# Patient Record
Sex: Female | Born: 1940 | Race: White | Hispanic: No | Marital: Married | Smoking: Never smoker
Health system: Southern US, Community
[De-identification: ages and names within clinical notes are randomized; demographics above are authoritative.]

## PROBLEM LIST (undated history)

## (undated) DIAGNOSIS — T7840XA Allergy, unspecified, initial encounter: Secondary | ICD-10-CM

## (undated) DIAGNOSIS — IMO0001 Reserved for inherently not codable concepts without codable children: Secondary | ICD-10-CM

## (undated) DIAGNOSIS — E559 Vitamin D deficiency, unspecified: Secondary | ICD-10-CM

## (undated) DIAGNOSIS — E049 Nontoxic goiter, unspecified: Secondary | ICD-10-CM

## (undated) DIAGNOSIS — K219 Gastro-esophageal reflux disease without esophagitis: Secondary | ICD-10-CM

## (undated) DIAGNOSIS — I38 Endocarditis, valve unspecified: Secondary | ICD-10-CM

## (undated) DIAGNOSIS — E785 Hyperlipidemia, unspecified: Secondary | ICD-10-CM

## (undated) DIAGNOSIS — K5790 Diverticulosis of intestine, part unspecified, without perforation or abscess without bleeding: Secondary | ICD-10-CM

## (undated) DIAGNOSIS — M1990 Unspecified osteoarthritis, unspecified site: Secondary | ICD-10-CM

## (undated) DIAGNOSIS — E039 Hypothyroidism, unspecified: Secondary | ICD-10-CM

## (undated) DIAGNOSIS — E079 Disorder of thyroid, unspecified: Secondary | ICD-10-CM

## (undated) HISTORY — DX: Disorder of thyroid, unspecified: E07.9

## (undated) HISTORY — PX: BREAST EXCISIONAL BIOPSY: SUR124

## (undated) HISTORY — DX: Reserved for inherently not codable concepts without codable children: IMO0001

## (undated) HISTORY — PX: BREAST SURGERY: SHX581

## (undated) HISTORY — DX: Gastro-esophageal reflux disease without esophagitis: K21.9

## (undated) HISTORY — DX: Allergy, unspecified, initial encounter: T78.40XA

---

## 2004-12-01 ENCOUNTER — Ambulatory Visit: Payer: Self-pay | Admitting: Ophthalmology

## 2005-04-27 ENCOUNTER — Ambulatory Visit: Payer: Self-pay | Admitting: Internal Medicine

## 2005-10-28 ENCOUNTER — Ambulatory Visit: Payer: Self-pay

## 2006-03-23 ENCOUNTER — Ambulatory Visit: Payer: Self-pay | Admitting: Internal Medicine

## 2006-05-04 ENCOUNTER — Ambulatory Visit: Payer: Self-pay | Admitting: Internal Medicine

## 2007-01-06 ENCOUNTER — Ambulatory Visit: Payer: Self-pay | Admitting: Internal Medicine

## 2008-01-17 ENCOUNTER — Ambulatory Visit: Payer: Self-pay | Admitting: Internal Medicine

## 2008-01-31 ENCOUNTER — Ambulatory Visit: Payer: Self-pay | Admitting: Internal Medicine

## 2008-02-14 ENCOUNTER — Ambulatory Visit: Payer: Self-pay | Admitting: Internal Medicine

## 2008-06-25 ENCOUNTER — Ambulatory Visit: Payer: Self-pay | Admitting: Internal Medicine

## 2009-03-05 ENCOUNTER — Ambulatory Visit: Payer: Self-pay | Admitting: Internal Medicine

## 2010-03-24 ENCOUNTER — Ambulatory Visit: Payer: Self-pay | Admitting: Obstetrics and Gynecology

## 2010-09-28 ENCOUNTER — Ambulatory Visit: Payer: Self-pay | Admitting: Obstetrics and Gynecology

## 2011-03-29 ENCOUNTER — Ambulatory Visit: Payer: Self-pay | Admitting: Surgery

## 2012-05-15 ENCOUNTER — Ambulatory Visit: Payer: Self-pay | Admitting: Obstetrics and Gynecology

## 2012-08-09 ENCOUNTER — Ambulatory Visit: Payer: Self-pay | Admitting: Obstetrics and Gynecology

## 2012-09-15 ENCOUNTER — Ambulatory Visit: Payer: Self-pay | Admitting: Surgery

## 2012-09-18 LAB — PATHOLOGY REPORT

## 2013-08-13 ENCOUNTER — Ambulatory Visit: Payer: Self-pay | Admitting: Unknown Physician Specialty

## 2014-07-10 ENCOUNTER — Ambulatory Visit: Payer: Self-pay

## 2014-07-10 ENCOUNTER — Encounter: Payer: Self-pay | Admitting: Podiatry

## 2014-07-10 ENCOUNTER — Ambulatory Visit (INDEPENDENT_AMBULATORY_CARE_PROVIDER_SITE_OTHER): Payer: Medicare Other

## 2014-07-10 ENCOUNTER — Ambulatory Visit (INDEPENDENT_AMBULATORY_CARE_PROVIDER_SITE_OTHER): Payer: Medicare Other | Admitting: Podiatry

## 2014-07-10 VITALS — BP 137/76 | HR 78 | Resp 16 | Ht 61.0 in | Wt 176.0 lb

## 2014-07-10 DIAGNOSIS — M1990 Unspecified osteoarthritis, unspecified site: Secondary | ICD-10-CM | POA: Insufficient documentation

## 2014-07-10 DIAGNOSIS — E010 Iodine-deficiency related diffuse (endemic) goiter: Secondary | ICD-10-CM | POA: Insufficient documentation

## 2014-07-10 DIAGNOSIS — E559 Vitamin D deficiency, unspecified: Secondary | ICD-10-CM | POA: Insufficient documentation

## 2014-07-10 DIAGNOSIS — M722 Plantar fascial fibromatosis: Secondary | ICD-10-CM

## 2014-07-10 DIAGNOSIS — K219 Gastro-esophageal reflux disease without esophagitis: Secondary | ICD-10-CM | POA: Insufficient documentation

## 2014-07-10 DIAGNOSIS — K5790 Diverticulosis of intestine, part unspecified, without perforation or abscess without bleeding: Secondary | ICD-10-CM | POA: Insufficient documentation

## 2014-07-10 DIAGNOSIS — E785 Hyperlipidemia, unspecified: Secondary | ICD-10-CM | POA: Insufficient documentation

## 2014-07-10 DIAGNOSIS — E538 Deficiency of other specified B group vitamins: Secondary | ICD-10-CM | POA: Insufficient documentation

## 2014-07-10 DIAGNOSIS — E039 Hypothyroidism, unspecified: Secondary | ICD-10-CM | POA: Insufficient documentation

## 2014-07-10 DIAGNOSIS — Z87898 Personal history of other specified conditions: Secondary | ICD-10-CM | POA: Insufficient documentation

## 2014-07-10 MED ORDER — METHYLPREDNISOLONE (PAK) 4 MG PO TABS: ORAL_TABLET | ORAL | Status: DC

## 2014-07-10 MED ORDER — MELOXICAM 15 MG PO TABS: 15.0000 mg | ORAL_TABLET | Freq: Every day | ORAL | Status: DC

## 2014-07-10 NOTE — Patient Instructions (Signed)

## 2014-07-10 NOTE — Progress Notes (Signed)
   Subjective:    Patient ID: Peggy Hill, female    DOB: Aug 10, 1940, 73 y.o.   MRN: [id number]  HPI Comments: The right heel painful and it goes up the back of the heel . Its been going on for over a year. Have had problems in the past with the heels. Had injections and orthotics. The left heel hurts as well plantar heel pain . The pain seems to move around the foot   Foot Pain      Review of Systems  HENT:       Sinus problems  Eyes: Positive for itching.  Cardiovascular: Positive for leg swelling.  Gastrointestinal: Positive for diarrhea.  Hematological: Bruises/bleeds easily.  All other systems reviewed and are negative.      Objective:   Physical Exam: I have reviewed her past medical history medications allergy surgery social history and review systems. Pulses are strongly palpable bilateral. Neurologic sensorium is intact percent twisting monofilament. Degenerative flexors are intact bilaterally muscle strength +5 over 5 dorsiflexion plantar flexion inversion everters and musculatures intact. Orthopedic evaluation shows all joints distal to the ankle for range of motion without crepitation. Cutaneous evaluationof well-hydrated is no erythema or edema cellulitis drainage or odor. Orthopedic evaluation does demonstrate inflammation me with right greater than left. Radiographic evaluation does demonstrate soft tissue increase in density at the plantar fascial insertion site bilateral heels. Right greater than left.      Assessment & Plan:  Assessment: Pain in limb plantar fasciitis bilateral right greater than left.  Plan: Discussed etiology pathology conservative versus surgical therapies. At this point injected her bilateral heels with Kenalog and local anesthetic dispensed a plantar fascial brace bilaterally and the night splint for the use of the worst foot. Dispensed prescription for Medrol Dosepak to be followed by meloxicam. We discussed appropriate shoe gear stress or  size ice therapy and shoe gear modifications area I will follow-up with her in 1 month.

## 2014-08-07 ENCOUNTER — Ambulatory Visit: Payer: No Typology Code available for payment source | Admitting: Podiatry

## 2014-08-19 ENCOUNTER — Ambulatory Visit (INDEPENDENT_AMBULATORY_CARE_PROVIDER_SITE_OTHER): Payer: Medicare Other | Admitting: Podiatry

## 2014-08-19 VITALS — BP 127/76 | HR 77 | Resp 16

## 2014-08-19 DIAGNOSIS — M722 Plantar fascial fibromatosis: Secondary | ICD-10-CM

## 2014-08-19 NOTE — Progress Notes (Signed)
She presents today chief complaint of plantar fasciitis. She states that it is 90% resolved.  Objective: Pulses are palpable pain. No reproducible pain on palpation.  Assessment: Residual plantar fasciitis right.  Plan: Continue to encourage range of motion exercises and continue all conservative therapies.

## 2014-11-22 NOTE — Op Note (Signed)
PATIENT NAME:  Peggy Hill, Peggy Hill MR#:  [id number] DATE OF BIRTH:  [birth year]  DATE OF PROCEDURE:  09/15/2012  PREOPERATIVE DIAGNOSIS: Right breast mass.   POSTOPERATIVE DIAGNOSIS: Right breast mass.  OPERATION: Right breast excisional biopsy.   SURGEON: Rodena Goldmann, M.D.   ANESTHESIA: General.   OPERATIVE PROCEDURE: With the patient in the supine position after the induction of appropriate general anesthesia, the patient's right breast was prepped with Betadine and draped in sterile towels. A transverse incision was made over the palpable lesion. The incision was carried down through the subcutaneous tissue with Bovie electrocautery. The lesion was palpated, and dissection carried out with the Bovie. However, I entered a cavity, which appeared to be a large contraction capsule from her previous augmentation surgery. A large portion of that area was removed down to the chest wall. The area was irrigated. Because of the large defect, a 7 mm flat Jackson-Pratt drain was inserted for assistance with postoperative drainage. The drain was secured with 3-0 nylon. The subcutaneous space was obliterated with 3-0 Vicryl. The skin was closed with a running subcuticular suture of 4-0 Vicryl. Benzoin and Steri-Strips were applied. The wounds were dressed sterilely and the patient went to the recovery room having tolerated the procedure well. Sponge, instrument and needle counts were correct x 2 in the operating room.    ____________________________ Rodena Goldmann III, MD rle:aw D: 09/15/2012 09:28:40 ET T: 09/15/2012 09:56:51 ET JOB#: [id number]  cc: Micheline Maze, MD, <Dictator> Adin Hector, MD Rodena Goldmann MD ELECTRONICALLY SIGNED 09/22/2012 17:17

## 2015-01-30 ENCOUNTER — Other Ambulatory Visit: Payer: Self-pay | Admitting: Obstetrics and Gynecology

## 2015-01-30 DIAGNOSIS — Z1231 Encounter for screening mammogram for malignant neoplasm of breast: Secondary | ICD-10-CM

## 2015-01-31 ENCOUNTER — Ambulatory Visit
Admission: RE | Admit: 2015-01-31 | Discharge: 2015-01-31 | Disposition: A | Discharge status: Home / Self Care | Payer: Medicare Other | Source: Ambulatory Visit | Attending: Obstetrics and Gynecology | Admitting: Obstetrics and Gynecology

## 2015-01-31 ENCOUNTER — Other Ambulatory Visit: Payer: Self-pay | Admitting: Obstetrics and Gynecology

## 2015-01-31 DIAGNOSIS — Z1231 Encounter for screening mammogram for malignant neoplasm of breast: Secondary | ICD-10-CM

## 2017-06-29 ENCOUNTER — Other Ambulatory Visit: Payer: Self-pay | Admitting: Internal Medicine

## 2017-06-29 DIAGNOSIS — Z1231 Encounter for screening mammogram for malignant neoplasm of breast: Secondary | ICD-10-CM

## 2017-07-21 ENCOUNTER — Ambulatory Visit
Admission: RE | Admit: 2017-07-21 | Discharge: 2017-07-21 | Disposition: A | Discharge status: Home / Self Care | Payer: Medicare Other | Source: Ambulatory Visit | Attending: Internal Medicine | Admitting: Internal Medicine

## 2017-07-21 DIAGNOSIS — Z1231 Encounter for screening mammogram for malignant neoplasm of breast: Secondary | ICD-10-CM

## 2017-09-05 ENCOUNTER — Other Ambulatory Visit
Admission: RE | Admit: 2017-09-05 | Discharge: 2017-09-05 | Disposition: A | Discharge status: Home / Self Care | Payer: Medicare Other | Source: Ambulatory Visit | Attending: Nurse Practitioner | Admitting: Nurse Practitioner

## 2017-09-05 DIAGNOSIS — R197 Diarrhea, unspecified: Secondary | ICD-10-CM | POA: Diagnosis present

## 2017-09-05 LAB — GASTROINTESTINAL PANEL BY PCR, STOOL (REPLACES STOOL CULTURE)

## 2017-09-05 LAB — C DIFFICILE QUICK SCREEN W PCR REFLEX
C Diff antigen: NEGATIVE
C Diff interpretation: NOT DETECTED
C Diff toxin: NEGATIVE

## 2017-11-09 ENCOUNTER — Encounter
Admission: RE | Disposition: A | Discharge status: Home / Self Care | Payer: Self-pay | Source: Ambulatory Visit | Attending: Unknown Physician Specialty

## 2017-11-09 ENCOUNTER — Ambulatory Visit: Payer: Medicare Other | Admitting: Certified Registered"

## 2017-11-09 ENCOUNTER — Ambulatory Visit
Admission: RE | Admit: 2017-11-09 | Discharge: 2017-11-09 | Disposition: A | Discharge status: Home / Self Care | Payer: Medicare Other | Source: Ambulatory Visit | Attending: Unknown Physician Specialty | Admitting: Unknown Physician Specialty

## 2017-11-09 ENCOUNTER — Encounter: Payer: Self-pay | Admitting: *Deleted

## 2017-11-09 DIAGNOSIS — K5730 Diverticulosis of large intestine without perforation or abscess without bleeding: Secondary | ICD-10-CM | POA: Insufficient documentation

## 2017-11-09 DIAGNOSIS — Z791 Long term (current) use of non-steroidal anti-inflammatories (NSAID): Secondary | ICD-10-CM | POA: Diagnosis not present

## 2017-11-09 DIAGNOSIS — F419 Anxiety disorder, unspecified: Secondary | ICD-10-CM | POA: Insufficient documentation

## 2017-11-09 DIAGNOSIS — Z79890 Hormone replacement therapy (postmenopausal): Secondary | ICD-10-CM | POA: Insufficient documentation

## 2017-11-09 DIAGNOSIS — K640 First degree hemorrhoids: Secondary | ICD-10-CM | POA: Diagnosis not present

## 2017-11-09 DIAGNOSIS — Z7982 Long term (current) use of aspirin: Secondary | ICD-10-CM | POA: Diagnosis not present

## 2017-11-09 DIAGNOSIS — M1990 Unspecified osteoarthritis, unspecified site: Secondary | ICD-10-CM | POA: Insufficient documentation

## 2017-11-09 DIAGNOSIS — E559 Vitamin D deficiency, unspecified: Secondary | ICD-10-CM | POA: Diagnosis not present

## 2017-11-09 DIAGNOSIS — E039 Hypothyroidism, unspecified: Secondary | ICD-10-CM | POA: Diagnosis not present

## 2017-11-09 DIAGNOSIS — Z79899 Other long term (current) drug therapy: Secondary | ICD-10-CM | POA: Diagnosis not present

## 2017-11-09 DIAGNOSIS — D120 Benign neoplasm of cecum: Secondary | ICD-10-CM | POA: Insufficient documentation

## 2017-11-09 DIAGNOSIS — K219 Gastro-esophageal reflux disease without esophagitis: Secondary | ICD-10-CM | POA: Diagnosis not present

## 2017-11-09 DIAGNOSIS — K529 Noninfective gastroenteritis and colitis, unspecified: Secondary | ICD-10-CM | POA: Diagnosis present

## 2017-11-09 HISTORY — DX: Diverticulosis of intestine, part unspecified, without perforation or abscess without bleeding: K57.90

## 2017-11-09 HISTORY — DX: Vitamin D deficiency, unspecified: E55.9

## 2017-11-09 HISTORY — DX: Unspecified osteoarthritis, unspecified site: M19.90

## 2017-11-09 HISTORY — DX: Endocarditis, valve unspecified: I38

## 2017-11-09 HISTORY — DX: Nontoxic goiter, unspecified: E04.9

## 2017-11-09 HISTORY — DX: Hyperlipidemia, unspecified: E78.5

## 2017-11-09 HISTORY — DX: Hypothyroidism, unspecified: E03.9

## 2017-11-09 HISTORY — PX: COLONOSCOPY WITH PROPOFOL: SHX5780

## 2017-11-09 HISTORY — DX: Gastro-esophageal reflux disease without esophagitis: K21.9

## 2017-11-09 SURGERY — COLONOSCOPY WITH PROPOFOL
Anesthesia: General

## 2017-11-09 MED ORDER — LIDOCAINE HCL (CARDIAC) 20 MG/ML IV SOLN
INTRAVENOUS | Status: DC | PRN
  Administered 2017-11-09: 50 mg via INTRATRACHEAL

## 2017-11-09 MED ORDER — LIDOCAINE HCL (PF) 2 % IJ SOLN
INTRAMUSCULAR | Status: AC
  Filled 2017-11-09: qty 10

## 2017-11-09 MED ORDER — SODIUM CHLORIDE 0.9 % IV SOLN: INTRAVENOUS | Status: DC | PRN

## 2017-11-09 MED ORDER — PROPOFOL 10 MG/ML IV BOLUS
INTRAVENOUS | Status: DC | PRN
  Administered 2017-11-09 (×2): 100 mg via INTRAVENOUS

## 2017-11-09 MED ORDER — SODIUM CHLORIDE 0.9 % IV SOLN: INTRAVENOUS | Status: DC

## 2017-11-09 MED ORDER — SODIUM CHLORIDE 0.9 % IV SOLN
INTRAVENOUS | Status: DC | PRN
  Administered 2017-11-09: 14:00:00 via INTRAVENOUS

## 2017-11-09 MED ORDER — PROPOFOL 500 MG/50ML IV EMUL
INTRAVENOUS | Status: DC | PRN
  Administered 2017-11-09: 100 ug/kg/min via INTRAVENOUS

## 2017-11-09 MED ORDER — PROPOFOL 500 MG/50ML IV EMUL
INTRAVENOUS | Status: AC
  Filled 2017-11-09: qty 50

## 2017-11-09 MED ORDER — PROPOFOL 10 MG/ML IV BOLUS
INTRAVENOUS | Status: AC
  Filled 2017-11-09: qty 20

## 2017-11-09 NOTE — Anesthesia Preprocedure Evaluation (Signed)
Anesthesia Evaluation  Patient identified by MRN, date of birth, ID band Patient awake    Reviewed: Allergy & Precautions, H&P , NPO status , reviewed documented beta blocker date and time   Airway Mallampati: II  TM Distance: >3 FB Neck ROM: full    Dental  (+) Chipped   Pulmonary    Pulmonary exam normal        Cardiovascular Normal cardiovascular exam     Neuro/Psych PSYCHIATRIC DISORDERS Anxiety    GI/Hepatic GERD  Controlled and Medicated,  Endo/Other  Hypothyroidism   Renal/GU      Musculoskeletal  (+) Arthritis ,   Abdominal   Peds  Hematology   Anesthesia Other Findings B12 deficiency  Arthritis, degenerative  Acid reflux  Big thyroid  DD (diverticular disease)  History of prolonged Q-T interval on ECG Echo 2011 with preserved LV function and mild MR.  Echo 3/13 unchanged from 2012             HLD (hyperlipidemia)  Adult hypothyroidism  Avitaminosis D    Reproductive/Obstetrics                             Anesthesia Physical Anesthesia Plan  ASA: III  Anesthesia Plan: General   Post-op Pain Management:    Induction:   PONV Risk Score and Plan: 3 and Propofol infusion  Airway Management Planned:   Additional Equipment:   Intra-op Plan:   Post-operative Plan:   Informed Consent: I have reviewed the patients History and Physical, chart, labs and discussed the procedure including the risks, benefits and alternatives for the proposed anesthesia with the patient or authorized representative who has indicated his/her understanding and acceptance.   Dental Advisory Given  Plan Discussed with: CRNA  Anesthesia Plan Comments:         Anesthesia Quick Evaluation

## 2017-11-09 NOTE — H&P (Signed)
Primary Care Physician:  Adin Hector, MD Primary Gastroenterologist:  Dr. Vira Agar  Pre-Procedure History & Physical: HPI:  Peggy Hill is a 77 y.o. female is here for an colonoscopy. This is for diarrhea.   Past Medical History:  Diagnosis Date  . Allergy   . Arthritis    DJD  . Diverticulosis   . GERD (gastroesophageal reflux disease)   . Goiter   . Hyperlipidemia   . Hypothyroidism   . Reflux   . Thyroid disease   . Valvular heart disease   . Vitamin D deficiency     Past Surgical History:  Procedure Laterality Date  . BREAST EXCISIONAL BIOPSY Right    negative,  . BREAST SURGERY      Prior to Admission medications   Medication Sig Start Date End Date Taking? Authorizing Provider  albuterol (PROAIR HFA) 108 (90 BASE) MCG/ACT inhaler Inhale into the lungs.   Yes [provider]  aspirin EC 325 MG tablet Take by mouth.   Yes [provider]  calcium carbonate (OS-CAL) 600 MG tablet Take by mouth.   Yes [provider]  Cholecalciferol (D 1000) 1000 UNITS capsule Take by mouth.   Yes [provider]  clonazePAM (KLONOPIN) 0.5 MG tablet  05/13/14  Yes [provider]  Cyanocobalamin 2500 MCG CHEW Chew by mouth.   Yes [provider]  levothyroxine (SYNTHROID, LEVOTHROID) 112 MCG tablet Take 112 mcg by mouth daily before breakfast.   Yes [provider]  meloxicam (MOBIC) 7.5 MG tablet Take by mouth.   Yes [provider]  methylPREDNIsolone (MEDROL DOSPACK) 4 MG tablet 6 day tapering dose-follow package instructions 07/10/14  Yes Hyatt, Max T, DPM  Omega-3 Fatty Acids (FISH OIL CONCENTRATE) 300 MG CAPS Take by mouth.   Yes [provider]  omeprazole (PRILOSEC) 20 MG capsule Take 20 mg by mouth daily.   Yes [provider]  Saccharomyces boulardii (FLORASTOR PO) Take by mouth 2 (two) times daily.   Yes [provider]  levothyroxine (SYNTHROID, LEVOTHROID) 100 MCG  tablet Take 100 mcg by mouth.     [provider]  meloxicam (MOBIC) 15 MG tablet Take 1 tablet (15 mg total) by mouth daily. Patient not taking: Reported on 11/09/2017 07/10/14   Garrel Ridgel, DPM  pantoprazole (PROTONIX) 40 MG tablet  05/08/14   [provider]    Allergies as of 11/08/2017  . (No Known Allergies)    Family History  Problem Relation Age of Onset  . Breast cancer Mother 30    Social History   Socioeconomic History  . Marital status: Unknown    Spouse name: Not on file  . Number of children: Not on file  . Years of education: Not on file  . Highest education level: Not on file  Occupational History  . Not on file  Social Needs  . Financial resource strain: Not on file  . Food insecurity:    Worry: Not on file    Inability: Not on file  . Transportation needs:    Medical: Not on file    Non-medical: Not on file  Tobacco Use  . Smoking status: Never Smoker  . Smokeless tobacco: Never Used  Substance and Sexual Activity  . Alcohol use: No    Alcohol/week: 0.0 oz  . Drug use: Never  . Sexual activity: Not on file  Lifestyle  . Physical activity:    Days per week: Not on file  Minutes per session: Not on file  . Stress: Not on file  Relationships  . Social connections:    Talks on phone: Not on file    Gets together: Not on file    Attends religious service: Not on file    Active member of club or organization: Not on file    Attends meetings of clubs or organizations: Not on file    Relationship status: Not on file  . Intimate partner violence:    Fear of current or ex partner: Not on file    Emotionally abused: Not on file    Physically abused: Not on file    Forced sexual activity: Not on file  Other Topics Concern  . Not on file  Social History Narrative  . Not on file    Review of Systems: See HPI, otherwise negative ROS  Physical Exam: BP 133/80   Pulse 95   Temp (!) 97.1 F (36.2 C) (Tympanic)   Resp 20   Ht  5' (1.524 m)   Wt 79.8 kg (176 lb)   SpO2 100%   BMI 34.37 kg/m  General:   Alert,  pleasant and cooperative in NAD Head:  Normocephalic and atraumatic. Neck:  Supple; no masses or thyromegaly. Lungs:  Clear throughout to auscultation.    Heart:  Regular rate and rhythm. Abdomen:  Soft, nontender and nondistended. Normal bowel sounds, without guarding, and without rebound.   Neurologic:  Alert and  oriented x4;  grossly normal neurologically.  Impression/Plan: Peggy Hill is here for an colonoscopy to be performed for diarrhea.  Risks, benefits, limitations, and alternatives regarding  colonoscopy have been reviewed with the patient.  Questions have been answered.  All parties agreeable.   Gaylyn Cheers, MD  11/09/2017, 2:05 PM

## 2017-11-09 NOTE — Transfer of Care (Signed)
Immediate Anesthesia Transfer of Care Note  Patient: Peggy Hill  Procedure(s) Performed: COLONOSCOPY WITH PROPOFOL (N/A )  Patient Location: PACU  Anesthesia Type:General  Level of Consciousness: drowsy and patient cooperative  Airway & Oxygen Therapy: Patient Spontanous Breathing  Post-op Assessment: Report given to RN, Post -op Vital signs reviewed and stable and Patient moving all extremities  Post vital signs: Reviewed and stable  Last Vitals:  Vitals Value Taken Time  BP 115/57 11/09/2017  2:32 PM  Temp 36.1 C 11/09/2017  2:34 PM  Pulse 78 11/09/2017  2:34 PM  Resp 25 11/09/2017  2:34 PM  SpO2 95 % 11/09/2017  2:34 PM  Vitals shown include unvalidated device data.  Last Pain:  Vitals:   11/09/17 1434  TempSrc: Tympanic         Complications: No apparent anesthesia complications

## 2017-11-09 NOTE — Anesthesia Post-op Follow-up Note (Signed)
Anesthesia QCDR form completed.        

## 2017-11-09 NOTE — Op Note (Signed)
[facility] Gastroenterology Patient Name: Peggy Hill Procedure Date: 11/09/2017 1:47 PM MRN: [id number] Account #: [id number] Date of Birth: 08-Apr-1941 Admit Type: Outpatient Age: 77 Room: [facility] ENDO ROOM 3 Gender: Female Note Status: Finalized Procedure:            Colonoscopy Indications:          Chronic diarrhea Providers:            Manya Silvas, MD Referring MD:         Ramonita Lab, MD (Referring MD) Medicines:            Propofol per Anesthesia Complications:        No immediate complications. Procedure:            Pre-Anesthesia Assessment:                       - After reviewing the risks and benefits, the patient                        was deemed in satisfactory condition to undergo the                        procedure.                       After obtaining informed consent, the colonoscope was                        passed under direct vision. Throughout the procedure,                        the patient's blood pressure, pulse, and oxygen                        saturations were monitored continuously. The                        Colonoscope was introduced through the anus and                        advanced to the the cecum, identified by appendiceal                        orifice and ileocecal valve. The colonoscopy was                        performed without difficulty. The patient tolerated the                        procedure well. The quality of the bowel preparation                        was excellent. Findings:      Multiple small-mouthed diverticula were found in the sigmoid colon.      A diminutive polyp was found in the cecum. The polyp was sessile. The       polyp was removed with a jumbo cold forceps. Resection and retrieval       were complete.      Due to diarrhea and uncertain cause I biopsied the colon in       ascending/transverse colon; also descending colon, and  sigmoid were       biopsied to rule out microscopic colitis.     Internal hemorrhoids were found during endoscopy. The hemorrhoids were       small and Grade I (internal hemorrhoids that do not prolapse). Impression:           - Diverticulosis in the sigmoid colon.                       - One diminutive polyp in the cecum, removed with a                        jumbo cold forceps. Resected and retrieved.                       - Internal hemorrhoids. Recommendation:       - Await pathology results. Manya Silvas, MD 11/09/2017 2:34:56 PM This report has been signed electronically. Number of Addenda: 0 Note Initiated On: 11/09/2017 1:47 PM Scope Withdrawal Time: 0 hours 9 minutes 51 seconds  Total Procedure Duration: 0 hours 17 minutes 56 seconds       [facility]

## 2017-11-09 NOTE — Anesthesia Postprocedure Evaluation (Signed)
Anesthesia Post Note  Patient: Peggy Hill  Procedure(s) Performed: COLONOSCOPY WITH PROPOFOL (N/A )  Patient location during evaluation: Endoscopy Anesthesia Type: General Level of consciousness: awake and alert, oriented and patient cooperative Pain management: satisfactory to patient Vital Signs Assessment: post-procedure vital signs reviewed and stable Respiratory status: spontaneous breathing and respiratory function stable Cardiovascular status: blood pressure returned to baseline and stable Postop Assessment: no headache, no backache, patient able to bend at knees, no apparent nausea or vomiting and adequate PO intake Anesthetic complications: no     Last Vitals:  Vitals:   11/09/17 1432 11/09/17 1434  BP: (!) 115/57   Pulse: 80   Resp: (!) 25   Temp: (!) 36.1 C (!) 36.1 C  SpO2: 96%     Last Pain:  Vitals:   11/09/17 1434  TempSrc: Tympanic                 Cythnia Osmun H Luccas [place]

## 2017-11-10 ENCOUNTER — Encounter: Payer: Self-pay | Admitting: Unknown Physician Specialty

## 2017-11-11 LAB — SURGICAL PATHOLOGY

## 2018-07-31 ENCOUNTER — Other Ambulatory Visit: Payer: Self-pay | Admitting: Internal Medicine

## 2018-07-31 DIAGNOSIS — Z1231 Encounter for screening mammogram for malignant neoplasm of breast: Secondary | ICD-10-CM

## 2018-08-23 ENCOUNTER — Ambulatory Visit
Admission: RE | Admit: 2018-08-23 | Discharge: 2018-08-23 | Disposition: A | Discharge status: Home / Self Care | Payer: Medicare Other | Source: Ambulatory Visit | Attending: Internal Medicine | Admitting: Internal Medicine

## 2018-08-23 DIAGNOSIS — Z1231 Encounter for screening mammogram for malignant neoplasm of breast: Secondary | ICD-10-CM | POA: Diagnosis not present

## 2018-09-05 ENCOUNTER — Other Ambulatory Visit: Payer: Self-pay

## 2018-09-05 ENCOUNTER — Emergency Department
Admission: EM | Admit: 2018-09-05 | Discharge: 2018-09-05 | Disposition: A | Discharge status: Home / Self Care | Payer: Medicare Other | Attending: Emergency Medicine | Admitting: Emergency Medicine

## 2018-09-05 ENCOUNTER — Emergency Department: Payer: Medicare Other

## 2018-09-05 ENCOUNTER — Encounter: Payer: Self-pay | Admitting: Emergency Medicine

## 2018-09-05 DIAGNOSIS — B349 Viral infection, unspecified: Secondary | ICD-10-CM | POA: Insufficient documentation

## 2018-09-05 DIAGNOSIS — E039 Hypothyroidism, unspecified: Secondary | ICD-10-CM | POA: Insufficient documentation

## 2018-09-05 DIAGNOSIS — R112 Nausea with vomiting, unspecified: Secondary | ICD-10-CM

## 2018-09-05 DIAGNOSIS — Z79899 Other long term (current) drug therapy: Secondary | ICD-10-CM | POA: Diagnosis not present

## 2018-09-05 DIAGNOSIS — R197 Diarrhea, unspecified: Secondary | ICD-10-CM

## 2018-09-05 LAB — COMPREHENSIVE METABOLIC PANEL
ALBUMIN: 3.7 g/dL (ref 3.5–5.0)
ALT: 52 U/L — ABNORMAL HIGH (ref 0–44)
ANION GAP: 7 (ref 5–15)
AST: 58 U/L — ABNORMAL HIGH (ref 15–41)
Alkaline Phosphatase: 111 U/L (ref 38–126)
BILIRUBIN TOTAL: 0.6 mg/dL (ref 0.3–1.2)
BUN: 16 mg/dL (ref 8–23)
CHLORIDE: 107 mmol/L (ref 98–111)
CO2: 22 mmol/L (ref 22–32)
Calcium: 8.7 mg/dL — ABNORMAL LOW (ref 8.9–10.3)
Creatinine, Ser: 0.97 mg/dL (ref 0.44–1.00)
GFR calc Af Amer: >60 mL/min (ref >60)
GFR calc non Af Amer: 57 mL/min — ABNORMAL LOW (ref >60)
GLUCOSE: 141 mg/dL — AB (ref 70–99)
POTASSIUM: 3.6 mmol/L (ref 3.5–5.1)
SODIUM: 136 mmol/L (ref 135–145)
TOTAL PROTEIN: 7.6 g/dL (ref 6.5–8.1)

## 2018-09-05 LAB — CBC WITH DIFFERENTIAL/PLATELET
Abs Immature Granulocytes: 0.03 10*3/uL (ref 0.00–0.07)
BASOS ABS: 0.1 10*3/uL (ref 0.0–0.1)
BASOS PCT: 1 %
EOS ABS: 0.2 10*3/uL (ref 0.0–0.5)
Eosinophils Relative: 2 %
HEMATOCRIT: 43.8 % (ref 36.0–46.0)
Hemoglobin: 14 g/dL (ref 12.0–15.0)
Immature Granulocytes: 0 %
Lymphocytes Relative: 14 %
Lymphs Abs: 1.3 10*3/uL (ref 0.7–4.0)
MCH: 27.9 pg (ref 26.0–34.0)
MCHC: 32 g/dL (ref 30.0–36.0)
MCV: 87.4 fL (ref 80.0–100.0)
Monocytes Absolute: 1.1 10*3/uL — ABNORMAL HIGH (ref 0.1–1.0)
Monocytes Relative: 12 %
NEUTROS PCT: 71 %
NRBC: 0 % (ref 0.0–0.2)
Neutro Abs: 6.9 10*3/uL (ref 1.7–7.7)
PLATELETS: 196 10*3/uL (ref 150–400)
RBC: 5.01 MIL/uL (ref 3.87–5.11)
RDW: 13.5 % (ref 11.5–15.5)
WBC: 9.6 10*3/uL (ref 4.0–10.5)

## 2018-09-05 LAB — C DIFFICILE QUICK SCREEN W PCR REFLEX
C DIFFICILE (CDIFF) TOXIN: NEGATIVE
C DIFFICLE (CDIFF) ANTIGEN: NEGATIVE
C Diff interpretation: NOT DETECTED

## 2018-09-05 LAB — INFLUENZA PANEL BY PCR (TYPE A & B)
Influenza A By PCR: NEGATIVE
Influenza B By PCR: NEGATIVE

## 2018-09-05 LAB — TROPONIN I: Troponin I: <0.03 ng/mL (ref <0.03)

## 2018-09-05 MED ORDER — ONDANSETRON 4 MG PO TBDP: 4.0000 mg | ORAL_TABLET | Freq: Three times a day (TID) | ORAL | 0 refills | Status: AC | PRN

## 2018-09-05 MED ORDER — SODIUM CHLORIDE 0.9 % IV BOLUS
1000.0000 mL | Freq: Once | INTRAVENOUS | Status: AC
  Administered 2018-09-05: 1000 mL via INTRAVENOUS

## 2018-09-05 MED ORDER — ONDANSETRON HCL 4 MG/2ML IJ SOLN
4.0000 mg | Freq: Once | INTRAMUSCULAR | Status: AC
  Administered 2018-09-05: 4 mg via INTRAVENOUS

## 2018-09-05 MED ORDER — ONDANSETRON HCL 4 MG/2ML IJ SOLN
INTRAMUSCULAR | Status: AC
Start: 2018-09-05 — End: 2018-09-05
  Administered 2018-09-05: 4 mg via INTRAVENOUS
  Filled 2018-09-05: qty 2

## 2018-09-05 NOTE — ED Triage Notes (Signed)
Pt from home vi[facility]. Per EMS pt was dx with upper respiratory infection yesterday, pt was Rx 120mg  of doxycycline which she took one last night and this morning. Per EMS pt started to feel/have episodes of N/V/D this am; pt slept on recliner all night lat night. 4mg  of zofran was given to pt per [facility]. Upon arrival SpO2 82%, pt was placed on 2L [place] bring her up to 94% SpO2. Per EMS 132CBG; 170/80; [id number].

## 2018-09-05 NOTE — ED Notes (Signed)
ED Provider at bedside. 

## 2018-09-05 NOTE — ED Notes (Addendum)
Pt was able to ambulate to toilet. Pt SpO2 at 89%, pt placed on 2L [place]. PT has 1X of diarrhea. Pt denies any Nausea/dizziness  at this time. This RN will continue to monitor pt.

## 2018-09-05 NOTE — ED Notes (Signed)
Pt states she started doxycycline yesterday. Pt states "I took a pill last night and this morning and after that I had diarrhea" Pt denies any pain at this time. Pt states "I just feel weak"

## 2018-09-05 NOTE — ED Provider Notes (Signed)
[facility] Emergency Department Provider Note  ____________________________________________  Time seen: Approximately 9:31 AM  I have reviewed the triage vital signs and the nursing notes.   HISTORY  Chief Complaint Weakness   HPI Peggy Hill is a 78 y.o. female with history of hypothyroidism, GERD, HLD who presents for evaluation of N/V/D.  Patient reports 3 days ago she started with a cough and sore throat.  Yesterday she went to walk-in clinic and she was told she had a upper respiratory infection.  She was put on doxycycline.  She reports taking 3 doses of it.  She woke up in the middle of the night with severe nausea, has had several episodes of nonbloody nonbilious emesis and watery diarrhea since early this morning.  No abdominal pain, no chest pain or shortness of breath.  Patient denies any fevers at home however she has been taking DayQuil for her symptoms. She reports feeling very weak.   Past Medical History:  Diagnosis Date  . Allergy   . Arthritis    DJD  . Diverticulosis   . GERD (gastroesophageal reflux disease)   . Goiter   . Hyperlipidemia   . Hypothyroidism   . Reflux   . Thyroid disease   . Valvular heart disease   . Vitamin D deficiency     Patient Active Problem List   Diagnosis Date Noted  . B12 deficiency 07/10/2014  . Arthritis, degenerative 07/10/2014  . Acid reflux 07/10/2014  . Big thyroid 07/10/2014  . DD (diverticular disease) 07/10/2014  . History of prolonged Q-T interval on ECG 07/10/2014  . HLD (hyperlipidemia) 07/10/2014  . Adult hypothyroidism 07/10/2014  . Avitaminosis D 07/10/2014    Past Surgical History:  Procedure Laterality Date  . BREAST EXCISIONAL BIOPSY Right    negative,  . BREAST SURGERY    . COLONOSCOPY WITH PROPOFOL N/A 11/09/2017   Procedure: COLONOSCOPY WITH PROPOFOL;  Surgeon: Manya Silvas, MD;  Location: [facility] ENDOSCOPY;  Service: Endoscopy;  Laterality: N/A;    Prior to  Admission medications   Medication Sig Start Date End Date Taking? Authorizing Provider  albuterol (PROAIR HFA) 108 (90 BASE) MCG/ACT inhaler Inhale 2 puffs into the lungs every 6 (six) hours as needed.    Yes [provider]  Cholecalciferol (D 1000) 1000 UNITS capsule Take 1,000 Units by mouth daily.    Yes [provider]  clonazePAM (KLONOPIN) 0.5 MG tablet Take 0.5 mg by mouth daily as needed for anxiety.  05/13/14  Yes [provider]  Cyanocobalamin 2500 MCG TABS Take 2,500 mcg by mouth daily.    Yes [provider]  doxycycline (VIBRA-TABS) 100 MG tablet Take 100 mg by mouth 2 (two) times daily. 09/04/18 09/14/18 Yes [provider]  levothyroxine (SYNTHROID, LEVOTHROID) 112 MCG tablet Take 112 mcg by mouth daily before breakfast.   Yes [provider]  omeprazole (PRILOSEC) 20 MG capsule Take 20 mg by mouth daily.   Yes [provider]  ondansetron (ZOFRAN ODT) 4 MG disintegrating tablet Take 1 tablet (4 mg total) by mouth every 8 (eight) hours as needed. 09/05/18   Rudene Re, MD    Allergies Patient has no known allergies.  Family History  Problem Relation Age of Onset  . Breast cancer Mother 56    Social History Social History   Tobacco Use  . Smoking status: Never Smoker  . Smokeless tobacco: Never Used  Substance Use Topics  . Alcohol use: No  Alcohol/week: 0.0 standard drinks  . Drug use: Never    Review of Systems  Constitutional: Negative for fever. + generalized weakness Eyes: Negative for visual changes. ENT: + sore throat and congestion Neck: No neck pain  Cardiovascular: Negative for chest pain. Respiratory: Negative for shortness of breath. + cough Gastrointestinal: Negative for abdominal pain. + vomiting and diarrhea. Genitourinary: Negative for dysuria. Musculoskeletal: Negative for back pain. Skin: Negative for rash. Neurological: Negative for headaches, weakness or  numbness. Psych: No SI or HI  ____________________________________________   PHYSICAL EXAM:  VITAL SIGNS: Vitals:   09/05/18 1030 09/05/18 1103  BP: 137/70   Pulse: 72   Resp: 13   Temp:  97.6 F (36.4 C)  SpO2: 98%   HR 71  Constitutional: Alert and oriented, actively vomiting.  HEENT:      Head: Normocephalic and atraumatic.         Eyes: Conjunctivae are normal. Sclera is non-icteric.       Mouth/Throat: Mucous membranes are moist.       Neck: Supple with no signs of meningismus. Cardiovascular: Regular rate and rhythm. No murmurs, gallops, or rubs. 2+ symmetrical distal pulses are present in all extremities. No JVD. Respiratory: Normal respiratory effort. Lungs are clear to auscultation bilaterally. No wheezes, crackles, or rhonchi.  Gastrointestinal: Soft, non tender, and non distended with positive bowel sounds. No rebound or guarding. Musculoskeletal: Nontender with normal range of motion in all extremities. No edema, cyanosis, or erythema of extremities. Neurologic: Normal speech and language. Face is symmetric. Moving all extremities. No gross focal neurologic deficits are appreciated. Skin: Skin is warm, dry and intact. No rash noted. Psychiatric: Mood and affect are normal. Speech and behavior are normal.  ____________________________________________   LABS (all labs ordered are listed, but only abnormal results are displayed)  Labs Reviewed  CBC WITH DIFFERENTIAL/PLATELET - Abnormal; Notable for the following components:      Result Value   Monocytes Absolute 1.1 (*)    All other components within normal limits  COMPREHENSIVE METABOLIC PANEL - Abnormal; Notable for the following components:   Glucose, Bld 141 (*)    Calcium 8.7 (*)    AST 58 (*)    ALT 52 (*)    GFR calc non Af Amer 57 (*)    All other components within normal limits  C DIFFICILE QUICK SCREEN W PCR REFLEX  INFLUENZA PANEL BY PCR (TYPE A & B)  TROPONIN I    ____________________________________________  EKG  ED ECG REPORT I, Rudene Re, the attending physician, personally viewed and interpreted this ECG.  Normal sinus rhythm with first-degree AV block, rate of 75, normal QTC, borderline left axis deviation, Q waves in inferior and anterior leads, no ST elevations or depressions.  No prior for comparison. ____________________________________________  RADIOLOGY  I have personally reviewed the images performed during this visit and I agree with the Radiologist's read.   Interpretation by Radiologist:  Dg Chest 2 View  Result Date: 09/05/2018 CLINICAL DATA:  Cough. EXAM: CHEST - 2 VIEW COMPARISON:  None. FINDINGS: The heart size and mediastinal contours are within normal limits. Both lungs are clear. The visualized skeletal structures are unremarkable. IMPRESSION: No active cardiopulmonary disease. Electronically Signed   By: Marijo Conception, M.D.   On: 09/05/2018 09:34      ____________________________________________   PROCEDURES  Procedure(s) performed: None Procedures Critical Care performed:  None ____________________________________________   INITIAL IMPRESSION / ASSESSMENT AND PLAN / ED COURSE   78 y.o. female  with history of hypothyroidism, GERD, HLD who presents for evaluation of N/V/D and generalized weakness after 3 days of cough, congestion, sore throat and now on doxycycline.  Ddx Flu, PNA, antibiotic side effect  Initially hypoxic however with moving in bed for cleaning of diarrhea, hypoxia corrected most likely atelectasis. Will continue to monitor closely. Abdomen is soft with no tenderness to palpation, lungs are clear to auscultation with normal work of breathing.  Plan for chest x-ray, labs, flu swab, will send stool for C. difficile.  Will give IV fluids and Zofran.  Clinical Course as of Sep 05 1154  Tue Sep 05, 2018  1153 Labs with no acute findings.  Flu negative.  C. difficile is negative.  Patient  is tolerating p.o. with no further episodes of vomiting.  At this time will discharge home with a prescription for Zofran and close follow-up with her primary care doctor.  Patient does endorse a history of chronic diarrhea for which she follows up with GI.  I recommended stopping doxycycline since her chest x-ray is negative for pneumonia and she does not have a history of COPD or emphysema.  Discussed more of a bland diet.  Discussed standard return precautions with her and her husband.   [CV]    Clinical Course User Index [CV] Alfred Levins [place], MD     As part of my medical decision making, I reviewed the following data within the [place] notes reviewed and incorporated, Labs reviewed , Old chart reviewed, Radiograph reviewed , Notes from prior ED visits and [place] Controlled Substance Database    Pertinent labs & imaging results that were available during my care of the patient were reviewed by me and considered in my medical decision making (see chart for details).    ____________________________________________   FINAL CLINICAL IMPRESSION(S) / ED DIAGNOSES  Final diagnoses:  Nausea vomiting and diarrhea  Viral illness      NEW MEDICATIONS STARTED DURING THIS VISIT:  ED Discharge Orders         Ordered    ondansetron (ZOFRAN ODT) 4 MG disintegrating tablet  Every 8 hours PRN     09/05/18 1156           Note:  This document was prepared using Dragon voice recognition software and may include unintentional dictation errors.    Alfred Levins, [place], MD 09/05/18 [phone]

## 2018-09-05 NOTE — ED Notes (Signed)
Patient transported to X-ray 

## 2018-09-05 NOTE — ED Notes (Signed)
Blanket given to pt. Family at bedside.

## 2019-08-08 ENCOUNTER — Other Ambulatory Visit: Payer: Self-pay | Admitting: Internal Medicine

## 2019-08-08 DIAGNOSIS — R748 Abnormal levels of other serum enzymes: Secondary | ICD-10-CM

## 2019-08-13 ENCOUNTER — Ambulatory Visit
Admission: RE | Admit: 2019-08-13 | Discharge: 2019-08-13 | Disposition: A | Discharge status: Home / Self Care | Payer: Medicare Other | Source: Ambulatory Visit | Attending: Internal Medicine | Admitting: Internal Medicine

## 2019-08-13 ENCOUNTER — Other Ambulatory Visit: Payer: Self-pay

## 2019-08-13 DIAGNOSIS — R748 Abnormal levels of other serum enzymes: Secondary | ICD-10-CM

## 2019-10-19 ENCOUNTER — Other Ambulatory Visit: Payer: Self-pay | Admitting: Internal Medicine

## 2019-10-22 ENCOUNTER — Other Ambulatory Visit: Payer: Self-pay | Admitting: Internal Medicine

## 2019-10-22 DIAGNOSIS — Z1231 Encounter for screening mammogram for malignant neoplasm of breast: Secondary | ICD-10-CM

## 2019-11-01 ENCOUNTER — Ambulatory Visit
Admission: RE | Admit: 2019-11-01 | Discharge: 2019-11-01 | Disposition: A | Discharge status: Home / Self Care | Payer: Medicare Other | Source: Ambulatory Visit | Attending: Internal Medicine | Admitting: Internal Medicine

## 2019-11-01 DIAGNOSIS — Z1231 Encounter for screening mammogram for malignant neoplasm of breast: Secondary | ICD-10-CM | POA: Diagnosis not present

## 2019-12-06 IMAGING — MG MM DIGITAL SCREENING BILAT W/ TOMO W/ CAD
8 of 12 series · 8 of 28 positions shown · non-contrast
Comparison: Previous exam(s).

CLINICAL DATA: Screening.

EXAM:
2D DIGITAL SCREENING BILATERAL MAMMOGRAM WITH CAD AND ADJUNCT TOMO

[L MLO]
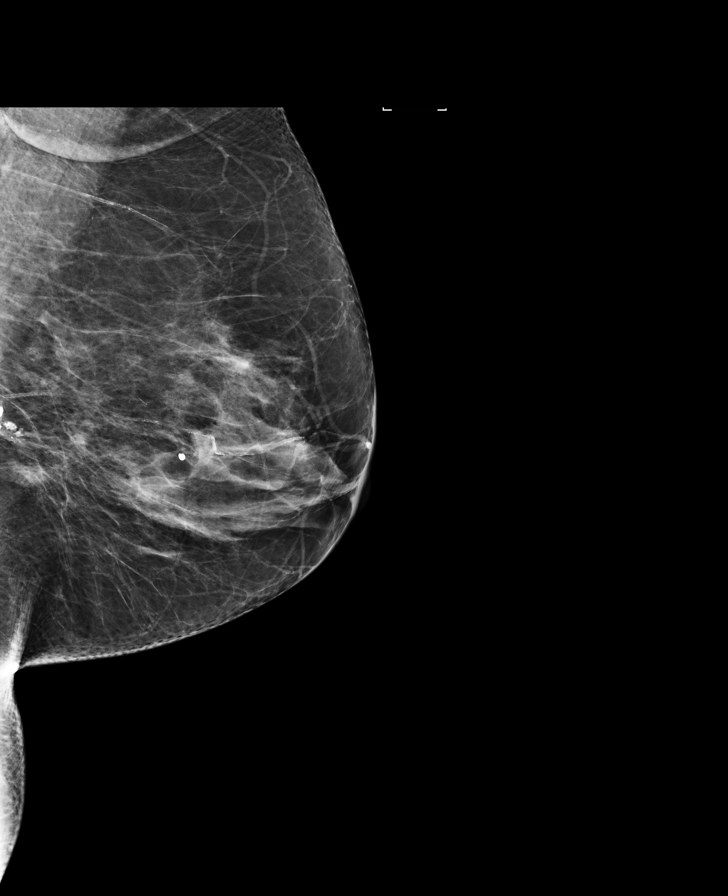

[L MLO synth-2D]
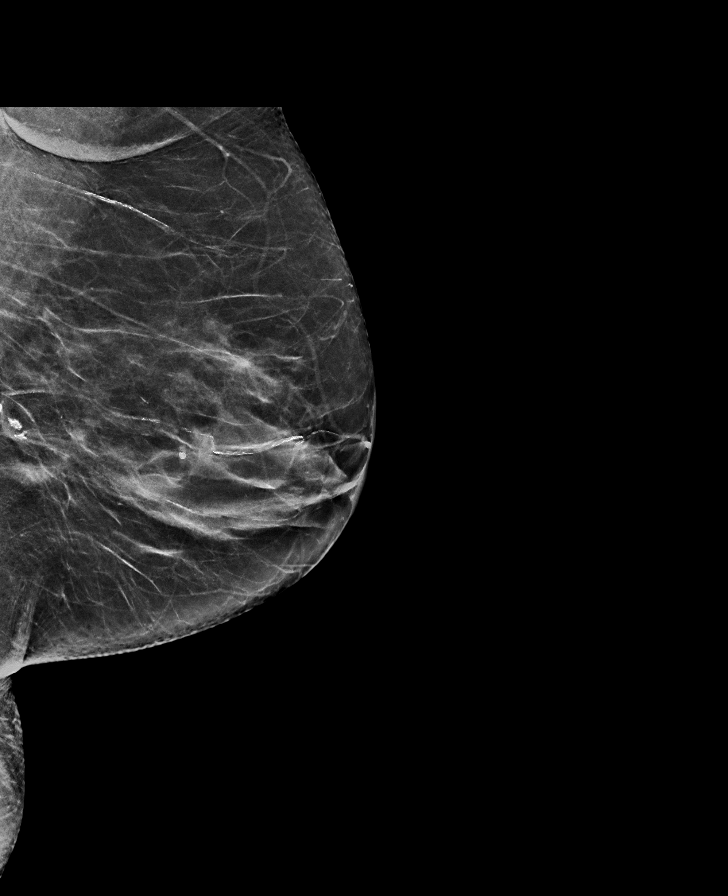

[R CC synth-2D]
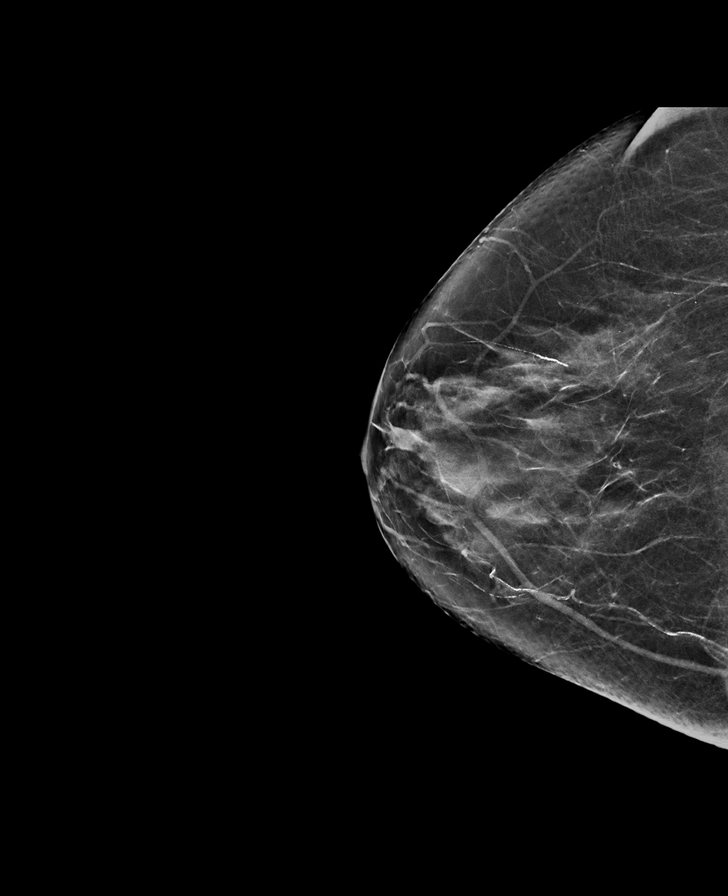

[L CC]
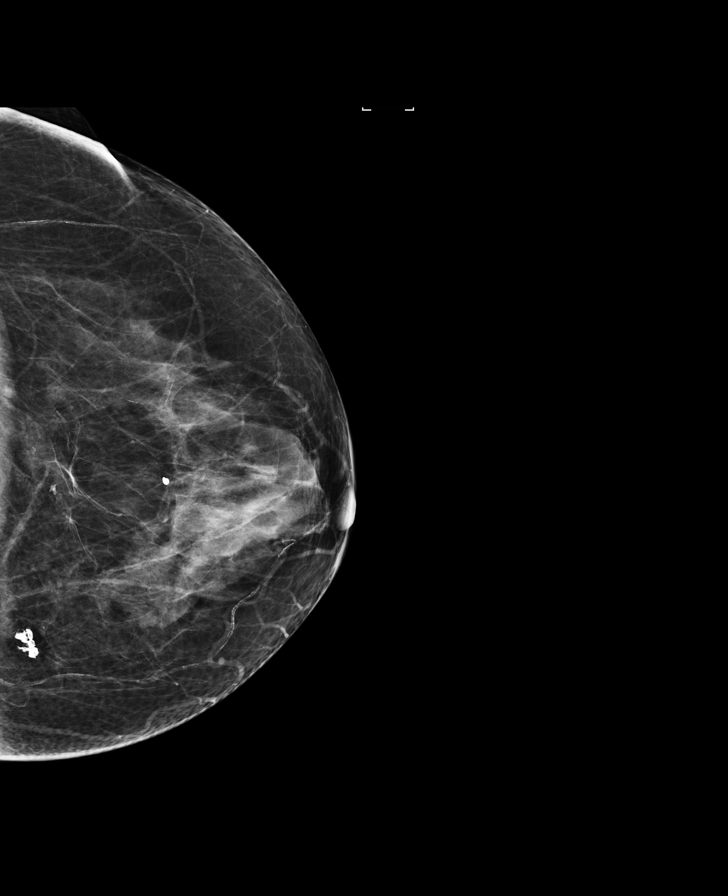

[R CC]
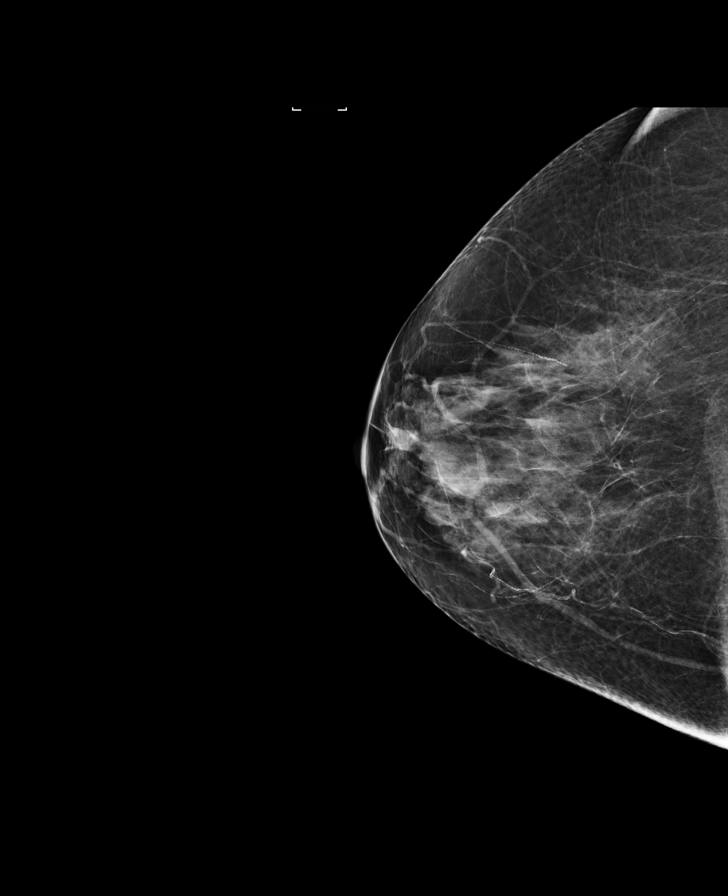

[R MLO synth-2D]
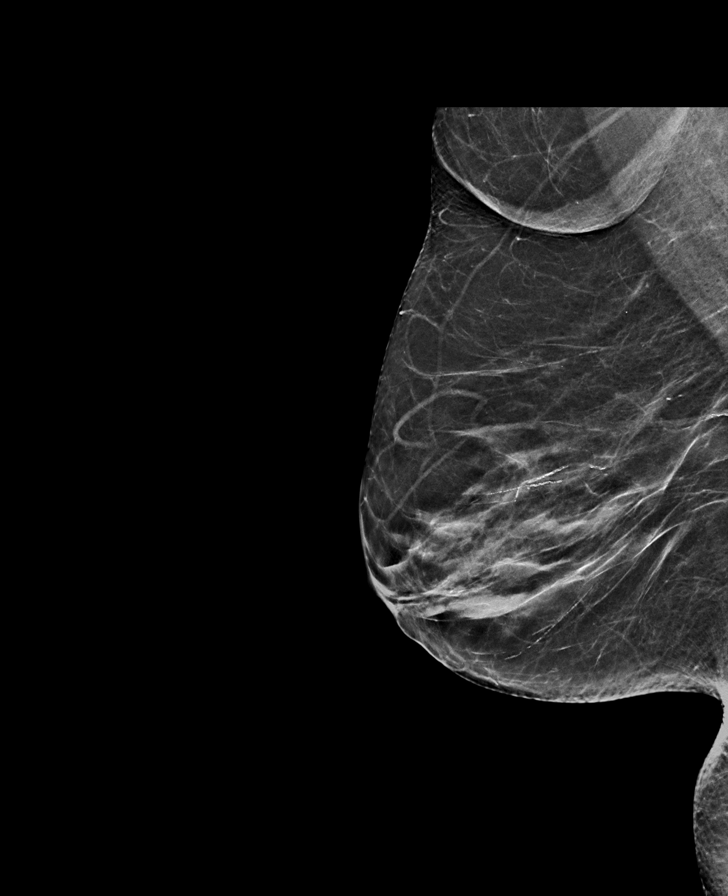

[R MLO]
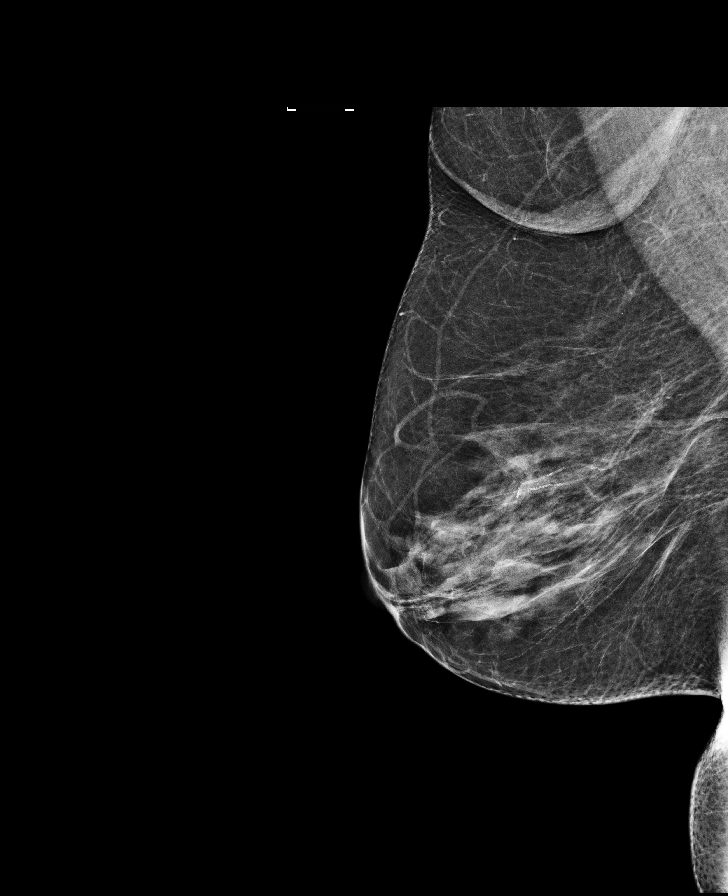

[L CC synth-2D]
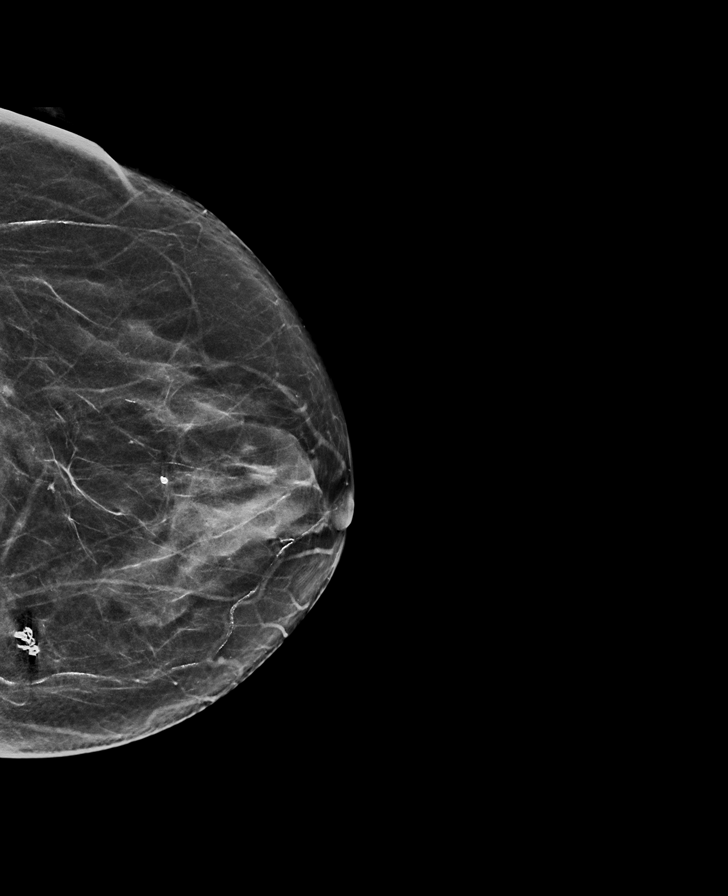

[8 of 28 positions shown; findings below may reference images not displayed]

ACR Breast Density Category c: The breast tissue is heterogeneously
dense, which may obscure small masses.
FINDINGS: There are no findings suspicious for malignancy. Images were
processed with CAD.
IMPRESSION: No mammographic evidence of malignancy. A result letter of this
screening mammogram will be mailed directly to the patient.

RECOMMENDATION:
Screening mammogram in one year. (Code:[id number])

BI-RADS CATEGORY  1: Negative.

## 2020-10-16 ENCOUNTER — Other Ambulatory Visit: Payer: Self-pay | Admitting: Internal Medicine

## 2020-10-16 DIAGNOSIS — Z1231 Encounter for screening mammogram for malignant neoplasm of breast: Secondary | ICD-10-CM

## 2020-12-11 ENCOUNTER — Other Ambulatory Visit: Payer: Self-pay

## 2020-12-11 ENCOUNTER — Ambulatory Visit
Admission: RE | Admit: 2020-12-11 | Discharge: 2020-12-11 | Disposition: A | Discharge status: Home / Self Care | Payer: Medicare Other | Source: Ambulatory Visit | Attending: Internal Medicine | Admitting: Internal Medicine

## 2020-12-11 DIAGNOSIS — Z1231 Encounter for screening mammogram for malignant neoplasm of breast: Secondary | ICD-10-CM | POA: Diagnosis not present

## 2021-11-02 ENCOUNTER — Other Ambulatory Visit: Payer: Self-pay | Admitting: Internal Medicine

## 2021-11-02 DIAGNOSIS — Z1231 Encounter for screening mammogram for malignant neoplasm of breast: Secondary | ICD-10-CM

## 2021-12-14 ENCOUNTER — Ambulatory Visit
Admission: RE | Admit: 2021-12-14 | Discharge: 2021-12-14 | Disposition: A | Discharge status: Home / Self Care | Payer: Medicare Other | Source: Ambulatory Visit | Attending: Internal Medicine | Admitting: Internal Medicine

## 2021-12-14 DIAGNOSIS — Z1231 Encounter for screening mammogram for malignant neoplasm of breast: Secondary | ICD-10-CM

## 2021-12-22 ENCOUNTER — Emergency Department
Admission: EM | Admit: 2021-12-22 | Discharge: 2021-12-22 | Disposition: A | Discharge status: Home / Self Care | Payer: Medicare Other | Attending: Emergency Medicine | Admitting: Emergency Medicine

## 2021-12-22 ENCOUNTER — Encounter: Payer: Self-pay | Admitting: Emergency Medicine

## 2021-12-22 ENCOUNTER — Other Ambulatory Visit: Payer: Self-pay

## 2021-12-22 ENCOUNTER — Emergency Department: Payer: Medicare Other

## 2021-12-22 DIAGNOSIS — R519 Headache, unspecified: Secondary | ICD-10-CM | POA: Insufficient documentation

## 2021-12-22 DIAGNOSIS — E039 Hypothyroidism, unspecified: Secondary | ICD-10-CM | POA: Insufficient documentation

## 2021-12-22 DIAGNOSIS — R413 Other amnesia: Secondary | ICD-10-CM | POA: Diagnosis present

## 2021-12-22 LAB — URINALYSIS, ROUTINE W REFLEX MICROSCOPIC
Bilirubin Urine: NEGATIVE
Glucose, UA: NEGATIVE mg/dL
Ketones, ur: NEGATIVE mg/dL
Nitrite: NEGATIVE
Protein, ur: NEGATIVE mg/dL
Specific Gravity, Urine: 1.005 (ref 1.005–1.030)
pH: 6 (ref 5.0–8.0)

## 2021-12-22 LAB — CBC
HCT: 43.1 % (ref 36.0–46.0)
Hemoglobin: 13.5 g/dL (ref 12.0–15.0)
MCH: 28.1 pg (ref 26.0–34.0)
MCHC: 31.3 g/dL (ref 30.0–36.0)
MCV: 89.8 fL (ref 80.0–100.0)
Platelets: 261 10*3/uL (ref 150–400)
RBC: 4.8 MIL/uL (ref 3.87–5.11)
RDW: 13.3 % (ref 11.5–15.5)
WBC: 9.2 10*3/uL (ref 4.0–10.5)
nRBC: 0 % (ref 0.0–0.2)

## 2021-12-22 LAB — BASIC METABOLIC PANEL
Anion gap: 7 (ref 5–15)
BUN: 13 mg/dL (ref 8–23)
CO2: 25 mmol/L (ref 22–32)
Calcium: 9.2 mg/dL (ref 8.9–10.3)
Chloride: 107 mmol/L (ref 98–111)
Creatinine, Ser: 1.07 mg/dL — ABNORMAL HIGH (ref 0.44–1.00)
GFR, Estimated: 53 mL/min — ABNORMAL LOW (ref >60)
Glucose, Bld: 113 mg/dL — ABNORMAL HIGH (ref 70–99)
Potassium: 3.7 mmol/L (ref 3.5–5.1)
Sodium: 139 mmol/L (ref 135–145)

## 2021-12-22 MED ORDER — LORAZEPAM 1 MG PO TABS
1.0000 mg | ORAL_TABLET | Freq: Once | ORAL | Status: AC
  Administered 2021-12-22: 1 mg via ORAL
  Filled 2021-12-22: qty 1

## 2021-12-22 NOTE — ED Triage Notes (Signed)
Pt to ED from home with husband c/o memory loss for the last 1-2 months that husband states is pretty consistent.  Patient is forgetting where she is, where she's going, seems disoriented.  Denies new medications, denies hx of dementia.  Pt states pain to neck since yesterday, states head feels like it's in a barrel and maybe lightheaded.  States feels sensation to urinate but can't but has been for years.  Otherwise pt is A&Ox4 at this time, chest rise even and unlabored, skin WNL, in NAD at this time.

## 2021-12-22 NOTE — ED Notes (Signed)
Report received from John, RN

## 2021-12-22 NOTE — ED Provider Notes (Signed)
[facility] Provider Note    Event Date/Time   First MD Initiated Contact with Patient 12/22/21 1623     (approximate)   History   Chief Complaint Memory Loss   HPI Peggy Hill is a 81 y.o. female, history of hyperlipidemia, GERD, regular heart disease, hypothyroidism, diverticulosis, presents to the emergency department for evaluation of memory loss.  Reports having mild headache and "mental fogginess" over the past 5 days.  Additionally, both her and her husband state that she has been disoriented lately.  At times, she will forget where she is, where she is going, and needs to be reminded.  This transient memory loss has been going on for the past 1 to 2 months.  She does not have any confirmed diagnosis of dementia or Alzheimer's at this time.  Denies fever/chills, chest pain, shortness of breath, abdominal pain, flank pain, nausea/vomiting, diarrhea, urinary symptoms, vision changes, hearing changes, or numbness/tingling upper or lower extremities.  She is not in any pain at this time.  History Limitations: No limitations.        Physical Exam  Triage Vital Signs: ED Triage Vitals  Enc Vitals Group     BP 12/22/21 1449 (!) 129/57     Pulse Rate 12/22/21 1449 68     Resp 12/22/21 1449 16     Temp 12/22/21 1449 98.3 F (36.8 C)     Temp src --      SpO2 12/22/21 1449 97 %     Weight 12/22/21 1508 143 lb (64.9 kg)     Height 12/22/21 1508 5' (1.524 m)     Head Circumference --      Peak Flow --      Pain Score 12/22/21 1502 2     Pain Loc --      Pain Edu? --      Excl. in [place]? --     Most recent vital signs: Vitals:   12/22/21 1449 12/22/21 1701  BP: (!) 129/57 130/66  Pulse: 68 85  Resp: 16 17  Temp: 98.3 F (36.8 C) 98.5 F (36.9 C)  SpO2: 97% 96%    General: Awake, NAD.  Skin: Warm, dry. No rashes or lesions.  Eyes: PERRL. Conjunctivae normal.  CV: Good peripheral perfusion.  Resp: Normal effort.  Abd: Soft, non-tender. No  distention.  Neuro: At baseline. No gross neurological deficits.  Cranial nerves II through XII intact.  5/5 strength in all extremities.  Sensation intact in all extremities.  Patient ambulates on her own without any difficulty.  Focused Exam: N/A.  Physical Exam    ED Results / Procedures / Treatments  Labs (all labs ordered are listed, but only abnormal results are displayed) Labs Reviewed  BASIC METABOLIC PANEL - Abnormal; Notable for the following components:      Result Value   Glucose, Bld 113 (*)    Creatinine, Ser 1.07 (*)    GFR, Estimated 53 (*)    All other components within normal limits  URINALYSIS, ROUTINE W REFLEX MICROSCOPIC - Abnormal; Notable for the following components:   Color, Urine STRAW (*)    APPearance CLEAR (*)    Hgb urine dipstick SMALL (*)    Leukocytes,Ua MODERATE (*)    Bacteria, UA RARE (*)    All other components within normal limits  CBC     EKG Sinus rhythm, rate 70, first-degree AV block present, no significant ST segment changes, normal QRS interval.   RADIOLOGY  ED Provider Interpretation: I personally viewed and interpreted this image, no evidence of any acute intracranial abnormalities.  MR BRAIN WO CONTRAST  Result Date: 12/22/2021 CLINICAL DATA:  Provided history: Mental status change, unknown cause. Additional history provided: Memory loss for 1-2 months. EXAM: MRI HEAD WITHOUT CONTRAST TECHNIQUE: Multiplanar, multiecho pulse sequences of the brain and surrounding structures were obtained without intravenous contrast. COMPARISON:  No pertinent prior exams available for comparison. FINDINGS: Brain: Mild generalized cerebral and cerebellar atrophy. Mild for age multifocal T2 FLAIR hyperintense signal abnormality within the cerebral white matter, nonspecific but compatible with chronic small vessel ischemic disease. There is no acute infarct. No evidence of an intracranial mass. No chronic intracranial blood products. No extra-axial  fluid collection. No midline shift. Vascular: Maintained flow voids within the proximal large arterial vessels. Skull and upper cervical spine: No focal suspicious marrow lesion. Sinuses/Orbits: No mass or acute finding within the imaged orbits. Prior bilateral ocular lens replacement. Susceptibility artifact partially obscures the right maxillary sinus. Minimal mucosal thickening within the bilateral ethmoid sinuses and right sphenoid. IMPRESSION: No evidence of acute intracranial abnormality. Mild chronic small-vessel ischemic changes within the cerebral white matter. Mild generalized cerebral and cerebellar atrophy. Electronically Signed   By: Kellie Simmering D.O.   On: 12/22/2021 18:54    PROCEDURES:  Critical Care performed: N/A  Procedures    MEDICATIONS ORDERED IN ED: Medications  LORazepam (ATIVAN) tablet 1 mg (1 mg Oral Given 12/22/21 1807)     IMPRESSION / MDM / ASSESSMENT AND PLAN / ED COURSE  I reviewed the triage vital signs and the nursing notes.                              Differential diagnosis includes, but is not limited to, dementia, Alzheimer's, TIA, previous stroke.  ED Course Patient appears well, vitals within normal limits.  NAD.  CBC shows no leukocytosis or anemia.  BMP shows no remarkable electrolyte abnormalities or evidence of significant AKI.  Urinalysis shows moderate leukocytes and rare bacteria.  Though in the absence of urinary symptoms, unlikely urinary tract infection.  Assessment/Plan Patient presents with mild headache with associated transient episodes of memory loss.  Physical examination does not reveal any neurological deficits.  Lab work-up has been unremarkable.  MRI shows mild generalized cerebellar and cerebral atrophy with small vessel ischemic changes, but otherwise no significant abnormalities.  Will provide patient with a referral to neurology for further evaluation and management.  Additionally recommend that she follow-up with her  primary care provider as needed.  We will plan to discharge.  Provided the patient with anticipatory guidance, return precautions, and educational material. Encouraged the patient to return to the emergency department at any time if they begin to experience any new or worsening symptoms. Patient expressed understanding and agreed with the plan.       FINAL CLINICAL IMPRESSION(S) / ED DIAGNOSES   Final diagnoses:  Memory loss     Rx / DC Orders   ED Discharge Orders     None        Note:  This document was prepared using Dragon voice recognition software and may include unintentional dictation errors.   Teodoro Spray, [place] 12/22/21 1906    Rada Hay, MD 12/22/21 [phone]

## 2021-12-22 NOTE — Discharge Instructions (Addendum)
-  Follow-up with the neurologist listed above, as discussed.  Follow-up with your primary care provider as well.  -Return to the emergency department anytime if you begin to experience any new or worsening symptoms.

## 2022-11-10 ENCOUNTER — Other Ambulatory Visit: Payer: Self-pay | Admitting: Internal Medicine

## 2022-11-10 DIAGNOSIS — Z1231 Encounter for screening mammogram for malignant neoplasm of breast: Secondary | ICD-10-CM

## 2023-01-20 ENCOUNTER — Ambulatory Visit
Admission: RE | Admit: 2023-01-20 | Discharge: 2023-01-20 | Disposition: A | Discharge status: Home / Self Care | Payer: Medicare Other | Source: Ambulatory Visit | Attending: Internal Medicine | Admitting: Internal Medicine

## 2023-01-20 DIAGNOSIS — Z1231 Encounter for screening mammogram for malignant neoplasm of breast: Secondary | ICD-10-CM | POA: Insufficient documentation

## 2024-01-11 ENCOUNTER — Other Ambulatory Visit: Payer: Self-pay | Admitting: Internal Medicine

## 2024-01-11 DIAGNOSIS — Z1231 Encounter for screening mammogram for malignant neoplasm of breast: Secondary | ICD-10-CM

## 2024-01-26 ENCOUNTER — Ambulatory Visit
Admission: RE | Admit: 2024-01-26 | Discharge: 2024-01-26 | Disposition: A | Discharge status: Home / Self Care | Source: Ambulatory Visit | Attending: Internal Medicine | Admitting: Internal Medicine

## 2024-01-26 DIAGNOSIS — Z1231 Encounter for screening mammogram for malignant neoplasm of breast: Secondary | ICD-10-CM | POA: Diagnosis present

## 2024-01-31 ENCOUNTER — Other Ambulatory Visit: Payer: Self-pay | Admitting: Internal Medicine

## 2024-01-31 DIAGNOSIS — R928 Other abnormal and inconclusive findings on diagnostic imaging of breast: Secondary | ICD-10-CM

## 2024-02-01 ENCOUNTER — Inpatient Hospital Stay
Admission: RE | Admit: 2024-02-01 | Discharge: 2024-02-01 | Discharge status: Home / Self Care | Source: Ambulatory Visit | Attending: Internal Medicine | Admitting: Internal Medicine

## 2024-02-01 ENCOUNTER — Ambulatory Visit
Admission: RE | Admit: 2024-02-01 | Discharge: 2024-02-01 | Disposition: A | Discharge status: Home / Self Care | Source: Ambulatory Visit | Attending: Internal Medicine | Admitting: Internal Medicine

## 2024-02-01 DIAGNOSIS — R928 Other abnormal and inconclusive findings on diagnostic imaging of breast: Secondary | ICD-10-CM
